# Patient Record
Sex: Male | Born: 1970 | Race: Black or African American | Hispanic: No | Marital: Single | Smoking: Never smoker
Health system: Southern US, Community
[De-identification: ages and names within clinical notes are randomized; demographics above are authoritative.]

## PROBLEM LIST (undated history)

## (undated) DIAGNOSIS — K219 Gastro-esophageal reflux disease without esophagitis: Secondary | ICD-10-CM

## (undated) DIAGNOSIS — IMO0001 Reserved for inherently not codable concepts without codable children: Secondary | ICD-10-CM

## (undated) DIAGNOSIS — K8020 Calculus of gallbladder without cholecystitis without obstruction: Secondary | ICD-10-CM

---

## 2002-08-25 ENCOUNTER — Encounter: Payer: Self-pay | Admitting: Emergency Medicine

## 2002-08-25 ENCOUNTER — Emergency Department (HOSPITAL_COMMUNITY): Admission: EM | Admit: 2002-08-25 | Discharge: 2002-08-25 | Payer: Self-pay | Admitting: Emergency Medicine

## 2002-08-26 ENCOUNTER — Ambulatory Visit (HOSPITAL_COMMUNITY): Admission: RE | Admit: 2002-08-26 | Discharge: 2002-08-26 | Payer: Self-pay | Admitting: Emergency Medicine

## 2002-08-26 ENCOUNTER — Encounter: Payer: Self-pay | Admitting: Emergency Medicine

## 2002-08-26 ENCOUNTER — Encounter: Payer: Self-pay | Admitting: *Deleted

## 2002-08-26 ENCOUNTER — Emergency Department (HOSPITAL_COMMUNITY): Admission: EM | Admit: 2002-08-26 | Discharge: 2002-08-26 | Payer: Self-pay | Admitting: *Deleted

## 2002-09-01 ENCOUNTER — Emergency Department (HOSPITAL_COMMUNITY): Admission: EM | Admit: 2002-09-01 | Discharge: 2002-09-01 | Payer: Self-pay | Admitting: Emergency Medicine

## 2006-12-10 ENCOUNTER — Emergency Department (HOSPITAL_COMMUNITY): Admission: EM | Admit: 2006-12-10 | Discharge: 2006-12-10 | Payer: Self-pay | Admitting: Emergency Medicine

## 2008-07-28 ENCOUNTER — Ambulatory Visit (HOSPITAL_COMMUNITY): Admission: RE | Admit: 2008-07-28 | Discharge: 2008-07-28 | Payer: Self-pay | Admitting: Family Medicine

## 2008-12-01 ENCOUNTER — Encounter (INDEPENDENT_AMBULATORY_CARE_PROVIDER_SITE_OTHER): Payer: Self-pay | Admitting: *Deleted

## 2009-07-08 ENCOUNTER — Emergency Department (HOSPITAL_COMMUNITY): Admission: EM | Admit: 2009-07-08 | Discharge: 2009-07-08 | Payer: Self-pay | Admitting: Emergency Medicine

## 2014-02-27 ENCOUNTER — Emergency Department (HOSPITAL_COMMUNITY): Payer: Self-pay

## 2014-02-27 ENCOUNTER — Encounter (HOSPITAL_COMMUNITY): Payer: Self-pay | Admitting: Emergency Medicine

## 2014-02-27 ENCOUNTER — Emergency Department (HOSPITAL_COMMUNITY)
Admission: EM | Admit: 2014-02-27 | Discharge: 2014-02-27 | Disposition: A | Discharge status: Home / Self Care | Payer: Self-pay | Attending: Emergency Medicine | Admitting: Emergency Medicine

## 2014-02-27 DIAGNOSIS — R000 Tachycardia, unspecified: Secondary | ICD-10-CM | POA: Insufficient documentation

## 2014-02-27 DIAGNOSIS — Z8719 Personal history of other diseases of the digestive system: Secondary | ICD-10-CM | POA: Insufficient documentation

## 2014-02-27 DIAGNOSIS — N453 Epididymo-orchitis: Secondary | ICD-10-CM | POA: Insufficient documentation

## 2014-02-27 DIAGNOSIS — N451 Epididymitis: Secondary | ICD-10-CM

## 2014-02-27 HISTORY — DX: Gastro-esophageal reflux disease without esophagitis: K21.9

## 2014-02-27 HISTORY — DX: Reserved for inherently not codable concepts without codable children: IMO0001

## 2014-02-27 LAB — URINALYSIS, ROUTINE W REFLEX MICROSCOPIC
Bilirubin Urine: NEGATIVE
GLUCOSE, UA: NEGATIVE mg/dL
HGB URINE DIPSTICK: NEGATIVE
KETONES UR: NEGATIVE mg/dL
Leukocytes, UA: NEGATIVE
Nitrite: POSITIVE — AB
Protein, ur: NEGATIVE mg/dL
Specific Gravity, Urine: 1.02 (ref 1.005–1.030)
Urobilinogen, UA: 1 mg/dL (ref 0.0–1.0)
pH: 7 (ref 5.0–8.0)

## 2014-02-27 LAB — URINE MICROSCOPIC-ADD ON

## 2014-02-27 MED ORDER — HYDROMORPHONE HCL PF 1 MG/ML IJ SOLN
1.0000 mg | Freq: Once | INTRAMUSCULAR | Status: AC
  Administered 2014-02-27: 1 mg via INTRAMUSCULAR
  Filled 2014-02-27: qty 1

## 2014-02-27 MED ORDER — DOXYCYCLINE HYCLATE 100 MG PO TABS
100.0000 mg | ORAL_TABLET | Freq: Once | ORAL | Status: AC
  Administered 2014-02-27: 100 mg via ORAL
  Filled 2014-02-27: qty 1

## 2014-02-27 MED ORDER — IBUPROFEN 800 MG PO TABS
800.0000 mg | ORAL_TABLET | Freq: Once | ORAL | Status: AC
  Administered 2014-02-27: 800 mg via ORAL
  Filled 2014-02-27: qty 1

## 2014-02-27 MED ORDER — DOXYCYCLINE HYCLATE 100 MG PO CAPS: 100.0000 mg | ORAL_CAPSULE | Freq: Two times a day (BID) | ORAL | Status: DC

## 2014-02-27 MED ORDER — ONDANSETRON 8 MG PO TBDP
8.0000 mg | ORAL_TABLET | Freq: Once | ORAL | Status: AC
  Administered 2014-02-27: 8 mg via ORAL
  Filled 2014-02-27: qty 1

## 2014-02-27 MED ORDER — OXYCODONE-ACETAMINOPHEN 5-325 MG PO TABS: 1.0000 | ORAL_TABLET | ORAL | Status: DC | PRN

## 2014-02-27 MED ORDER — CEFTRIAXONE SODIUM 250 MG IJ SOLR
250.0000 mg | Freq: Once | INTRAMUSCULAR | Status: AC
  Administered 2014-02-27: 250 mg via INTRAMUSCULAR
  Filled 2014-02-27: qty 250

## 2014-02-27 MED ORDER — IBUPROFEN 800 MG PO TABS: 800.0000 mg | ORAL_TABLET | Freq: Three times a day (TID) | ORAL | Status: DC

## 2014-02-27 MED ORDER — LIDOCAINE HCL (PF) 1 % IJ SOLN
INTRAMUSCULAR | Status: AC
  Administered 2014-02-27: 2 mL
  Filled 2014-02-27: qty 5

## 2014-02-27 MED ORDER — ACETAMINOPHEN 500 MG PO TABS
1000.0000 mg | ORAL_TABLET | Freq: Once | ORAL | Status: AC
  Administered 2014-02-27: 1000 mg via ORAL
  Filled 2014-02-27: qty 2

## 2014-02-27 NOTE — Discharge Instructions (Signed)
Epididymitis  Epididymitis is a swelling (inflammation) of the epididymis. The epididymis is a cord-like structure along the back part of the testicle. Epididymitis is usually, but not always, caused by infection. This is usually a sudden problem beginning with chills, fever and pain behind the scrotum and in the testicle. There may be swelling and redness of the testicle.  DIAGNOSIS   Physical examination will reveal a tender, swollen epididymis. Sometimes, cultures are obtained from the urine or from prostate secretions to help find out if there is an infection or if the cause is a different problem. Sometimes, blood work is performed to see if your white blood cell count is elevated and if a germ (bacterial) or viral infection is present. Using this knowledge, an appropriate medicine which kills germs (antibiotic) can be chosen by your caregiver. A viral infection causing epididymitis will most often go away (resolve) without treatment.  HOME CARE INSTRUCTIONS   · Hot sitz baths for 20 minutes, 4 times per day, may help relieve pain.  · Only take over-the-counter or prescription medicines for pain, discomfort or fever as directed by your caregiver.  · Take all medicines, including antibiotics, as directed. Take the antibiotics for the full prescribed length of time even if you are feeling better.  · It is very important to keep all follow-up appointments.  SEEK IMMEDIATE MEDICAL CARE IF:   · You have a fever.  · You have pain not relieved with medicines.  · You have any worsening of your problems.  · Your pain seems to come and go.  · You develop pain, redness, and swelling in the scrotum and surrounding areas.  MAKE SURE YOU:   · Understand these instructions.  · Will watch your condition.  · Will get help right away if you are not doing well or get worse.  Document Released: 09/08/2000 Document Revised: 12/04/2011 Document Reviewed: 07/29/2009  [employer]® Patient Information ©2014 [employer].

## 2014-02-27 NOTE — ED Provider Notes (Signed)
CSN: [id number]     Arrival date & time 02/27/14  1051 History   First MD Initiated Contact with Patient 02/27/14 1143     Chief Complaint  Patient presents with  . Groin Pain     (Consider location/radiation/quality/duration/timing/severity/associated sxs/prior Treatment) Patient is a 43 y.o. male presenting with groin pain. The history is provided by the patient.  Groin Pain This is a new problem. The current episode started yesterday. The problem occurs constantly. The problem has been unchanged. Pertinent negatives include no abdominal pain, arthralgias, change in bowel habit, chest pain, chills, diaphoresis, fever, headaches, myalgias, nausea, neck pain, numbness, rash, sore throat, swollen glands, urinary symptoms, vomiting or weakness. The symptoms are aggravated by standing, walking and bending. He has tried nothing for the symptoms. The treatment provided no relief.   Patient reports sudden onset of pain and swelling of his right testicle that began on the day prior to ED arrival after he bent over to pick up a large rock.  He states that he felt a "pull" to his groin right away and pain has been persistent since that time.  Pain is worse with movement of the scrotum, standing or walking.  He denies difficulty urinating, fever, chills, vomiting, abdominal pain, hematuria, penile pain or d/c, or back or leg pain.  He states he has not tried to ejaculate since the injury.  He has not taken any medications or tried any therapies prior to ED arrival.    Past Medical History  Diagnosis Date  . Reflux    History reviewed. No pertinent past surgical history. History reviewed. No pertinent family history. History  Substance Use Topics  . Smoking status: Never Smoker   . Smokeless tobacco: Never Used  . Alcohol Use: No    Review of Systems  Constitutional: Negative for fever, chills, diaphoresis and appetite change.  HENT: Negative for sore throat.   Respiratory: Negative for chest  tightness and shortness of breath.   Cardiovascular: Negative for chest pain.  Gastrointestinal: Negative for nausea, vomiting, abdominal pain, blood in stool and change in bowel habit.  Genitourinary: Positive for scrotal swelling and testicular pain. Negative for dysuria, frequency, hematuria, flank pain, decreased urine volume, discharge, difficulty urinating, genital sores and penile pain.  Musculoskeletal: Negative for arthralgias, back pain, myalgias and neck pain.  Skin: Negative for color change and rash.  Neurological: Negative for dizziness, weakness, numbness and headaches.  Hematological: Negative for adenopathy.  All other systems reviewed and are negative.     Allergies  Aspirin  Home Medications   Prior to Admission medications   Not on File   BP 129/76  Pulse 124  Temp(Src) 102.4 F (39.1 C) (Oral)  Resp 20  Ht 5\' 7"  (1.702 m)  Wt 216 lb 4.8 oz (98.113 kg)  BMI 33.87 kg/m2  SpO2 100% Physical Exam  Nursing note and vitals reviewed. Constitutional: He is oriented to person, place, and time. He appears well-developed and well-nourished. No distress.  HENT:  Head: Normocephalic and atraumatic.  Mouth/Throat: Oropharynx is clear and moist.  Neck: Normal range of motion. Neck supple.  Cardiovascular: Regular rhythm, normal heart sounds and intact distal pulses.  Tachycardia present.   No murmur heard. Pulmonary/Chest: Effort normal and breath sounds normal. No respiratory distress. He exhibits no tenderness.  Abdominal: Soft. He exhibits no distension and no mass. There is no tenderness. There is no rebound and no guarding. Hernia confirmed negative in the right inguinal area and confirmed negative in the left  inguinal area.  Genitourinary: Cremasteric reflex is present. Right testis shows swelling and tenderness. Right testis shows no mass. Left testis shows no mass, no swelling and no tenderness. Circumcised. No penile erythema or penile tenderness. No discharge  found.  Moderately enlarged right testicle on exam.  Prehn's sign is negative.  No palpable masses.  No erythema of the scrotum.  Left testicle is NT  Musculoskeletal: Normal range of motion.  Lymphadenopathy:    He has no cervical adenopathy.       Right: No inguinal adenopathy present.       Left: No inguinal adenopathy present.  Neurological: He is alert and oriented to person, place, and time. He exhibits normal muscle tone. Coordination normal.  Skin: Skin is warm and dry.  Psychiatric: He has a normal mood and affect.    ED Course  Procedures (including critical care time) Labs Review Labs Reviewed  URINALYSIS, ROUTINE W REFLEX MICROSCOPIC - Abnormal; Notable for the following:    APPearance HAZY (*)    Nitrite POSITIVE (*)    All other components within normal limits  URINE MICROSCOPIC-ADD ON - Abnormal; Notable for the following:    Bacteria, UA MANY (*)    All other components within normal limits  URINE CULTURE    Imaging Review US Scrotum  02/27/2014   ADDENDUM REPORT: 02/27/2014 17:01  ADDENDUM: Note that upon further review of the vascular portion of the exam, there is asymmetric increased flow to the right epididymis which correlates with patient's presenting symptoms and likely represents acute epididymitis.  These findings were discussed with Pauline Aus at the time of dictation.   Electronically Signed   By: Elberta Fortis M.D.   On: 02/27/2014 17:01   02/27/2014   ADDENDUM REPORT: 02/27/2014 14:51  ADDENDUM: Note that an arterial-venous Doppler evaluation was also performed with this exam.  Exam demonstrated normal symmetric arterial and venous flow to the testicles bilaterally.   Electronically Signed   By: Elberta Fortis M.D.   On: 02/27/2014 14:51   02/27/2014   CLINICAL DATA:  Right scrotal/groin pain for 2 days.  EXAM: ULTRASOUND OF SCROTUM  TECHNIQUE: Complete ultrasound examination of the testicles, epididymis, and other scrotal structures was performed.   COMPARISON:  None.  FINDINGS: Right testicle  Measurements: 3.7 x 4.1 x 5.9 cm. No mass or microlithiasis visualized.  Left testicle  Measurements: 2.5 x 3.2 x 5.1 cm. No mass or microlithiasis visualized.  Right epididymis:  Normal in size and appearance.  Left epididymis:  Normal in size and appearance.  Hydrocele:  Small simple bilateral hydroceles.  Varicocele:  None visualized.  Symmetric vascular flow to the testicles bilaterally.  IMPRESSION: Normal sonographic evaluation of the testicles.  Small bilateral simple hydroceles.  Electronically Signed: By: Elberta Fortis M.D. On: 02/27/2014 14:28     EKG Interpretation None      MDM   Final diagnoses:  Epididymitis, right   Pt is well appearing, non-toxic.  Reports h/o of sudden onset right testicle pain after lifting a large rock at work yesterday.  He denies any other symptoms, abdominal pain, vomiting, rash, penile discharge or redness of the scrotum.    Enlarged, tender right testicle without penile discharge, erythema or palpable hernia.  Will order U/A and scrotal US.     1650  Consulted radiologist, Dr. Micheline Maze.  After further review of the vascular portion of the [place] and discussion of pt's hx and lab results, it was noted that an increased flow is  present to the right epidymidis which correlates to the patient's sx's and likely represents acute epididymitis.    I have discussed the patient's hx , U/A and exam findings with Dr. Clarene DukeMcManus as well.  Plan includes treatment with Rocephin and doxycycline.      Urine culture is pending.  Pt remains well appearing and non-toxic.  He is resting comfortably, drank fluids, watching TV.  Tylenol and Ibuprofen given for fever.  Patient states pain is improved and he is feeling better.  He continues to deny any symptoms other than right testicle/scrotal pain.  No other source of fever found on exam.  Abdomen remains soft, NT.    patient's heart rate remains in the 120's and on further chart review,  it was noted that pt also seen here in 2008 with HR in 120's.  He agrees to doxy x 10 days, ibuprofen and #15 percocet for pain.  referral for Dr. Jerre SimonJavaid and i have advised him that he will also need further eval by his PMD, Dr. Renard MatterMcInnis, regarding the tachycardia.  He also advised and agrees to return here for any worsening symptoms.  He is ambulatory and appears stable for d/c  Cali Hope L. Marvelle Span, PA-C 03/01/14 1326

## 2014-02-27 NOTE — ED Notes (Signed)
Pain rt groin after picking up a rock yesterday.

## 2014-02-27 NOTE — ED Notes (Signed)
Patient was working [employer] yesterday lifting a rock when he felt a sudden pain in R groin.  No hx hernia.

## 2014-02-27 NOTE — ED Notes (Signed)
Alert, asking for something to drink,

## 2014-03-01 LAB — URINE CULTURE: Colony Count: >=100000

## 2014-03-02 NOTE — Progress Notes (Signed)
ED Antimicrobial Stewardship Positive Culture Follow Up   Martin Alvarado is an 43 y.o. male who presented to [facility] on 02/27/2014 with a chief complaint of  Chief Complaint  Patient presents with  . Groin Pain    Recent Results (from the past 720 hour(s))  URINE CULTURE     Status: None   Collection Time    02/27/14  1:40 PM      Result Value Ref Range Status   Specimen Description URINE, CLEAN CATCH   Final   Special Requests NONE   Final   Culture  Setup Time     Final   Value: 02/27/2014 23:36     Performed at [facility] Count     Final   Value: >=100,000 COLONIES/ML     Performed at [facility]   Culture     Final   Value: ESCHERICHIA COLI     Performed at [facility]   Report Status 03/01/2014 FINAL   Final   Organism ID, Bacteria ESCHERICHIA COLI   Final    [x]  Treated with doxycycline, organism resistant to prescribed antimicrobial 43 yo who came in with groin pain. He was dx with epididymitis and was dc home on doxy. Urine culture has grown out e.coli. We'll treat with amoxicillin since it's sens.  New antibiotic prescription:   Start Amoxicillin 500mg  TID x10days  ED Provider: Jaynie Crumble, PA   Ulyses Southward, PharmD Pager: [phone] Infectious Diseases Pharmacist Phone# [phone]

## 2014-03-03 ENCOUNTER — Telehealth (HOSPITAL_BASED_OUTPATIENT_CLINIC_OR_DEPARTMENT_OTHER): Payer: Self-pay | Admitting: Emergency Medicine

## 2014-03-03 NOTE — Telephone Encounter (Signed)
Post ED Visit - Positive Culture Follow-up: Successful Patient Follow-Up  Culture assessed and recommendations reviewed by: []  Wes Dulaney, Pharm.D., BCPS []  Celedonio Miyamoto, Pharm.D., BCPS []  Georgina Pillion, [address].D., BCPS [x]  [place], [place].D., BCPS, AAHIVP []  Estella Husk, Pharm.D., BCPS, AAHIVP  Positive urine culture  []  Patient discharged without antimicrobial prescription and treatment is now indicated [x]  Organism is resistant to prescribed ED discharge antimicrobial []  Patient with positive blood cultures  Changes discussed with ED provider: Jaynie Crumble PA-C New antibiotic prescription: Amoxicillin 500 mg PO TID x 10 days    Abie Cheek 03/03/2014, 1:18 PM

## 2014-03-03 NOTE — ED Provider Notes (Signed)
Medical screening examination/treatment/procedure(s) were performed by non-physician practitioner and as supervising physician I was immediately available for consultation/collaboration.   EKG Interpretation None        Laray Anger, DO 03/03/14 1147

## 2019-02-14 ENCOUNTER — Other Ambulatory Visit: Payer: Self-pay

## 2019-02-14 ENCOUNTER — Emergency Department (HOSPITAL_COMMUNITY)
Admission: EM | Admit: 2019-02-14 | Discharge: 2019-02-14 | Disposition: A | Discharge status: Home / Self Care | Payer: Self-pay | Attending: Emergency Medicine | Admitting: Emergency Medicine

## 2019-02-14 ENCOUNTER — Encounter (HOSPITAL_COMMUNITY): Payer: Self-pay | Admitting: Emergency Medicine

## 2019-02-14 ENCOUNTER — Emergency Department (HOSPITAL_COMMUNITY): Payer: Self-pay

## 2019-02-14 DIAGNOSIS — R1011 Right upper quadrant pain: Secondary | ICD-10-CM

## 2019-02-14 DIAGNOSIS — K8020 Calculus of gallbladder without cholecystitis without obstruction: Secondary | ICD-10-CM | POA: Insufficient documentation

## 2019-02-14 DIAGNOSIS — K769 Liver disease, unspecified: Secondary | ICD-10-CM | POA: Insufficient documentation

## 2019-02-14 LAB — COMPREHENSIVE METABOLIC PANEL
ALT: 62 U/L — ABNORMAL HIGH (ref 0–44)
AST: 33 U/L (ref 15–41)
Albumin: 4.7 g/dL (ref 3.5–5.0)
Alkaline Phosphatase: 35 U/L — ABNORMAL LOW (ref 38–126)
Anion gap: 17 — ABNORMAL HIGH (ref 5–15)
BUN: 12 mg/dL (ref 6–20)
CO2: 20 mmol/L — ABNORMAL LOW (ref 22–32)
Calcium: 9.4 mg/dL (ref 8.9–10.3)
Chloride: 98 mmol/L (ref 98–111)
Creatinine, Ser: 1.29 mg/dL — ABNORMAL HIGH (ref 0.61–1.24)
GFR calc Af Amer: >60 mL/min (ref >60)
GFR calc non Af Amer: >60 mL/min (ref >60)
Glucose, Bld: 93 mg/dL (ref 70–99)
Potassium: 3.9 mmol/L (ref 3.5–5.1)
Sodium: 135 mmol/L (ref 135–145)
Total Bilirubin: 2.5 mg/dL — ABNORMAL HIGH (ref 0.3–1.2)
Total Protein: 7.9 g/dL (ref 6.5–8.1)

## 2019-02-14 LAB — CBC
HCT: 41.7 % (ref 39.0–52.0)
Hemoglobin: 13.7 g/dL (ref 13.0–17.0)
MCH: 29 pg (ref 26.0–34.0)
MCHC: 32.9 g/dL (ref 30.0–36.0)
MCV: 88.2 fL (ref 80.0–100.0)
Platelets: 159 10*3/uL (ref 150–400)
RBC: 4.73 MIL/uL (ref 4.22–5.81)
RDW: 12.3 % (ref 11.5–15.5)
WBC: 5.5 10*3/uL (ref 4.0–10.5)
nRBC: 0 % (ref 0.0–0.2)

## 2019-02-14 LAB — TROPONIN I
Troponin I: <0.03 ng/mL (ref <0.03)
Troponin I: <0.03 ng/mL (ref <0.03)

## 2019-02-14 LAB — D-DIMER, QUANTITATIVE (NOT AT ARMC): D-Dimer, Quant: <0.27 ug/mL-FEU (ref 0.00–0.50)

## 2019-02-14 LAB — LIPASE, BLOOD: Lipase: 52 U/L — ABNORMAL HIGH (ref 11–51)

## 2019-02-14 MED ORDER — ALUM & MAG HYDROXIDE-SIMETH 200-200-20 MG/5ML PO SUSP
30.0000 mL | Freq: Once | ORAL | Status: AC
  Administered 2019-02-14: 30 mL via ORAL
  Filled 2019-02-14: qty 30

## 2019-02-14 MED ORDER — FAMOTIDINE IN NACL 20-0.9 MG/50ML-% IV SOLN
20.0000 mg | Freq: Once | INTRAVENOUS | Status: AC
  Administered 2019-02-14: 20 mg via INTRAVENOUS
  Filled 2019-02-14: qty 50

## 2019-02-14 MED ORDER — SODIUM CHLORIDE 0.9 % IV BOLUS
500.0000 mL | Freq: Once | INTRAVENOUS | Status: AC
  Administered 2019-02-14: 500 mL via INTRAVENOUS

## 2019-02-14 MED ORDER — SUCRALFATE 1 G PO TABS: 1.0000 g | ORAL_TABLET | Freq: Three times a day (TID) | ORAL | 0 refills | Status: AC

## 2019-02-14 MED ORDER — SUCRALFATE 1 G PO TABS
1.0000 g | ORAL_TABLET | Freq: Once | ORAL | Status: AC
  Administered 2019-02-14: 1 g via ORAL
  Filled 2019-02-14: qty 1

## 2019-02-14 MED ORDER — ACETAMINOPHEN 325 MG PO TABS
650.0000 mg | ORAL_TABLET | Freq: Once | ORAL | Status: AC
  Administered 2019-02-14: 650 mg via ORAL
  Filled 2019-02-14: qty 2

## 2019-02-14 MED ORDER — PANTOPRAZOLE SODIUM 20 MG PO TBEC: 20.0000 mg | DELAYED_RELEASE_TABLET | Freq: Every day | ORAL | 0 refills | Status: DC

## 2019-02-14 MED ORDER — SUCRALFATE 1 G PO TABS: 1.0000 g | ORAL_TABLET | Freq: Three times a day (TID) | ORAL | 0 refills | Status: DC

## 2019-02-14 MED ORDER — PANTOPRAZOLE SODIUM 20 MG PO TBEC: 20.0000 mg | DELAYED_RELEASE_TABLET | Freq: Every day | ORAL | 0 refills | Status: AC

## 2019-02-14 NOTE — ED Triage Notes (Signed)
Pt c/o that he has acid reflux and having really bad flare ups esp when laying down. C/o mid chest pains and little SOB that is worse today.

## 2019-02-14 NOTE — ED Notes (Signed)
US at bedside

## 2019-02-14 NOTE — Discharge Instructions (Signed)
Please take medications as prescribed.  Please follow-up with gastroenterology in regards to the lesion on your liver.  Please follow-up with general surgery in regards to the gallstones in your gallbladder.  Please return to the ER sooner if you have any new or worsening symptoms, or if you have any of the following symptoms:  Abdominal pain that does not go away.  You have a fever.  You keep throwing up (vomiting).  The pain is felt only in portions of the abdomen. Pain in the right side could possibly be appendicitis. In an adult, pain in the left lower portion of the abdomen could be colitis or diverticulitis.  You pass bloody or black tarry stools.  There is bright red blood in the stool.  The constipation stays for more than 4 days.  There is belly (abdominal) or rectal pain.  You do not seem to be getting better.  You have any questions or concerns.

## 2019-02-14 NOTE — ED Provider Notes (Signed)
[facility]-EMERGENCY DEPT Provider Note   CSN: [id number] Arrival date & time: 02/14/19  1616    History   Chief Complaint Chief Complaint  Patient presents with   Chest Pain    HPI Martin Alvarado is a 48 y.o. male.     HPI   Pt is a 48 y/o male with a h/o GERD who presents to the ED today c/o midsternal chest pain that has been present intermittently for the last 2 weeks but worsened over the last week.  States that symptoms are intermittent and last for about 30 minutes at a time.  Pain occurs almost every time he eats.  Patient states he does a lot of exercise by walking and it does not occur with exertion.  The pain feels sharp and stabbing. Rates pain 7-7/10. He states it feels similar to when he has had heartburn in the past.   It is also associated with shortness of breath.   He has tried lifestyle modification such as decreasing fried foods, carbonated beverages and increasing exercise to lose weight.  He also started taking his omeprazole yesterday.  He states that it helped in the morning and throughout the day however after laying down at night symptoms returned.  States he saw his PCP and gastroenterologist in the past who diagnosed him with GERD after EGD.  Denies hypertension, hyperlipidemia apnea, diabetes or tobacco use.  Denies any early family history of heart disease.  Denies frequent EtOH use or NSAID use.  States is allergic to aspirin. Denies leg pain/swelling, hemoptysis, recent surgery/trauma, recent long travel, hormone use, personal hx of cancer, or hx of DVT/PE.   Past Medical History:  Diagnosis Date   Reflux     There are no active problems to display for this patient.  History reviewed. No pertinent surgical history.    Home Medications    Prior to Admission medications   Medication Sig Start Date End Date Taking? Authorizing Provider  doxycycline (VIBRAMYCIN) 100 MG capsule Take 1 capsule (100 mg total) by mouth 2  (two) times daily. For 10 days Patient not taking: Reported on 02/14/2019 02/27/14   Pauline Aus, PA-C  ibuprofen (ADVIL,MOTRIN) 800 MG tablet Take 1 tablet (800 mg total) by mouth 3 (three) times daily. Patient not taking: Reported on 02/14/2019 02/27/14   Pauline Aus, PA-C  oxyCODONE-acetaminophen (PERCOCET/ROXICET) 5-325 MG per tablet Take 1 tablet by mouth every 4 (four) hours as needed for severe pain. Patient not taking: Reported on 02/14/2019 02/27/14   Triplett, Tammy, PA-C  pantoprazole (PROTONIX) 20 MG tablet Take 1 tablet (20 mg total) by mouth daily for 14 days. 02/14/19 02/28/19  Jolean Madariaga S, PA-C  sucralfate (CARAFATE) 1 g tablet Take 1 tablet (1 g total) by mouth 4 (four) times daily -  with meals and at bedtime for 13 days. 02/14/19 02/27/19  Traycen Goyer S, PA-C    Family History No family history on file.  Social History Social History   Tobacco Use   Smoking status: Never Smoker   Smokeless tobacco: Never Used  Substance Use Topics   Alcohol use: No   Drug use: No     Allergies   Aspirin   Review of Systems Review of Systems  Constitutional: Negative for chills and fever.  HENT: Negative for ear pain and sore throat.   Eyes: Negative for visual disturbance.  Respiratory: Positive for shortness of breath. Negative for cough.   Cardiovascular: Positive for chest pain. Negative for leg  swelling.  Gastrointestinal: Negative for abdominal pain, constipation, diarrhea, nausea and vomiting.  Genitourinary: Negative for dysuria and hematuria.  Musculoskeletal: Negative for back pain.  Skin: Negative for color change and rash.  Neurological: Negative for headaches.  All other systems reviewed and are negative.    Physical Exam Updated Vital Signs BP 132/89    Pulse 97    Temp 99.7 F (37.6 C) (Oral)    Resp 19    Ht 5\' 7"  (1.702 m)    Wt 88 kg    SpO2 100%    BMI 30.38 kg/m   Physical Exam Vitals signs and nursing note reviewed.  Constitutional:        Appearance: He is well-developed. He is not ill-appearing or toxic-appearing.  HENT:     Head: Normocephalic and atraumatic.  Eyes:     Conjunctiva/sclera: Conjunctivae normal.  Neck:     Musculoskeletal: Neck supple.  Cardiovascular:     Rate and Rhythm: Regular rhythm. Tachycardia present.     Pulses:          Dorsalis pedis pulses are 2+ on the right side and 2+ on the left side.     Heart sounds: Normal heart sounds. No murmur.  Pulmonary:     Effort: Pulmonary effort is normal. No respiratory distress.     Breath sounds: Normal breath sounds. No decreased breath sounds, wheezing, rhonchi or rales.  Chest:     Chest wall: No tenderness or crepitus.  Abdominal:     General: Bowel sounds are normal.     Palpations: Abdomen is soft.     Tenderness: There is no abdominal tenderness. There is no guarding or rebound.  Musculoskeletal:     Right lower leg: He exhibits no tenderness. No edema.     Left lower leg: He exhibits no tenderness. No edema.  Skin:    General: Skin is warm and dry.  Neurological:     Mental Status: He is alert.      ED Treatments / Results  Labs (all labs ordered are listed, but only abnormal results are displayed) Labs Reviewed  COMPREHENSIVE METABOLIC PANEL - Abnormal; Notable for the following components:      Result Value   CO2 20 (*)    Creatinine, Ser 1.29 (*)    ALT 62 (*)    Alkaline Phosphatase 35 (*)    Total Bilirubin 2.5 (*)    Anion gap 17 (*)    All other components within normal limits  LIPASE, BLOOD - Abnormal; Notable for the following components:   Lipase 52 (*)    All other components within normal limits  CBC  TROPONIN I  D-DIMER, QUANTITATIVE (NOT AT [facility])  TROPONIN I    EKG EKG Interpretation  Date/Time:  Friday Feb 14 2019 16:25:04 EDT Ventricular Rate:  120 PR Interval:    QRS Duration: 92 QT Interval:  307 QTC Calculation: 434 R Axis:   56 Text Interpretation:  Sinus tachycardia Nonspecific T  abnormalities, inferior leads Baseline wander in lead(s) II III aVL aVF V5 Confirmed by Pricilla Loveless [phone]) on 02/14/2019 5:22:02 PM    EKG Interpretation  Date/Time:  Friday Feb 14 2019 21:47:48 EDT Ventricular Rate:  99 PR Interval:    QRS Duration: 83 QT Interval:  356 QTC Calculation: 457 R Axis:   34 Text Interpretation:  Sinus rhythm Abnormal R-wave progression, early transition Borderline T abnormalities, inferior leads tachycardia is improved compared to earlier in the day Confirmed by Pricilla Loveless (  [id number]) on 02/14/2019 10:07:26 PM        Radiology Dg Chest 2 View  Result Date: 02/14/2019 CLINICAL DATA:  Chest pain, reflux, shortness of breath EXAM: CHEST - 2 VIEW COMPARISON:  None. FINDINGS: The heart size and mediastinal contours are within normal limits. Both lungs are clear. The visualized skeletal structures are unremarkable. IMPRESSION: No active cardiopulmonary disease. Electronically Signed   By: Judie Petit.  Shick M.D.   On: 02/14/2019 17:41   US Abdomen Limited Ruq  Result Date: 02/14/2019 CLINICAL DATA:  Initial evaluation for acute right upper quadrant and epigastric pain. EXAM: ULTRASOUND ABDOMEN LIMITED RIGHT UPPER QUADRANT COMPARISON:  None. FINDINGS: Gallbladder: Sludge and/or possible small stones present within the gallbladder lumen. Gallbladder wall measure within normal limits at 1.5 mm in thickness. No free pericholecystic fluid. No sonographic Murphy sign elicited on exam. Common bile duct: Diameter: 2.6 mm Liver: 2.3 x 2.5 x 2.6 cm echogenic lesion within the subcapsular left hepatic lobe, which could reflect a hemangioma or possibly focal fat deposition. Within normal limits in parenchymal echogenicity. Portal vein is patent on color Doppler imaging with normal direction of blood flow towards the liver. IMPRESSION: 1. Stones and sludge within the gallbladder lumen. No sonographic features to suggest acute cholecystitis. 2. No biliary dilatation. 3. 2.6 cm  echogenic lesion within the left hepatic lobe, indeterminate. Primary differential considerations include a benign hemangioma versus focal fat deposition. Follow-up examination with nonemergent MRI of the abdomen could be performed for further evaluation. Electronically Signed   By: Rise Mu M.D.   On: 02/14/2019 21:18    Procedures Procedures (including critical care time)  Medications Ordered in ED Medications  acetaminophen (TYLENOL) tablet 650 mg (650 mg Oral Given 02/14/19 1833)  famotidine (PEPCID) IVPB 20 mg premix (0 mg Intravenous Stopped 02/14/19 2032)  sucralfate (CARAFATE) tablet 1 g (1 g Oral Given 02/14/19 1834)  alum & mag hydroxide-simeth (MAALOX/MYLANTA) 200-200-20 MG/5ML suspension 30 mL (30 mLs Oral Given 02/14/19 1833)  sodium chloride 0.9 % bolus 500 mL (0 mLs Intravenous Stopped 02/14/19 2032)     Initial Impression / Assessment and Plan / ED Course  I have reviewed the triage vital signs and the nursing notes.  Pertinent labs & imaging results that were available during my care of the patient were reviewed by me and considered in my medical decision making (see chart for details).   Final Clinical Impressions(s) / ED Diagnoses   Final diagnoses:  RUQ pain  Gallstones  Liver lesion   Pt presenting with chest pain that he feels is consistent with acid reflux. Pain is intermittent, midsternal. it is associated with eating. associated with sob. No cough, fevers or pleuritic pain.   VS initially with marginal tachycardia, otherwise reassuring.  CBC without leukocytosis, no anemia CMP with low bicarb at 20, marginally elevated cr at 1.29. elevated ALT at 62, and elevated bilirubin at 2.5. Lipase is marginally elevated Trop negative, delta trop negative -- HEART score 2. This seems less likely related to ACS Ddimer negative -- low suspicion for PE  CXR negative for PNA, PTX or other obvious abnormality.   Initial EKG Sinus tachycardia Nonspecific T  abnormalities, inferior leads Baseline wander in lead(s) II III aVL aVF V5   Repeat EKG Sinus rhythm Abnormal R-wave progression, early transition Borderline T abnormalities, inferior leads tachycardia is improved compared to earlier in the day    Doubt dissection or other emergent cardiac/pulm etiology. Sxs seem much more likely to be GI related. Due to  elevated bilirubin, mildly elevated ALT, and chest pain worse with eating, added RUQ US to r/o gallbladder pathology.   RUQ US with Stones and sludge within the gallbladder lumen. No sonographic features to suggest acute cholecystitis. No biliary dilatation. 2.6 cm echogenic lesion within the left hepatic lobe, indeterminate. Primary differential considerations include a benign hemangioma versus focal fat deposition. Follow-up examination with nonemergent MRI of the abdomen could be performed for further evaluation.  CONSULT With GI, Dr. Dulce Sellarutlaw, who does not think that patient needs emergent MRCP at this time.    Discussed findings with patient and plan for f/u with GI for his liver lesion and with surgery for his gallstones. Will give rx for carafate and advise him to continue his omeprazole. Gave strict return precautions for any new or worsening sxs. He voices understanding of the plan and reasons to return. All questions answered.   ED Discharge Orders         Ordered    sucralfate (CARAFATE) 1 g tablet  3 times daily with meals & bedtime     02/14/19 2229    pantoprazole (PROTONIX) 20 MG tablet  Daily     02/14/19 2229           Karrie MeresCouture, Rufus Cypert S, PA-C 02/14/19 2231    Pricilla LovelessGoldston, Scott, MD 02/14/19 2253

## 2019-02-17 ENCOUNTER — Other Ambulatory Visit: Payer: Self-pay

## 2019-02-17 ENCOUNTER — Emergency Department (HOSPITAL_COMMUNITY): Payer: Self-pay

## 2019-02-17 ENCOUNTER — Emergency Department (HOSPITAL_COMMUNITY)
Admission: EM | Admit: 2019-02-17 | Discharge: 2019-02-17 | Disposition: A | Discharge status: Home / Self Care | Payer: Self-pay | Attending: Emergency Medicine | Admitting: Emergency Medicine

## 2019-02-17 ENCOUNTER — Encounter (HOSPITAL_COMMUNITY): Payer: Self-pay

## 2019-02-17 DIAGNOSIS — K219 Gastro-esophageal reflux disease without esophagitis: Secondary | ICD-10-CM | POA: Insufficient documentation

## 2019-02-17 DIAGNOSIS — R0789 Other chest pain: Secondary | ICD-10-CM

## 2019-02-17 LAB — CBC
HCT: 42.4 % (ref 39.0–52.0)
Hemoglobin: 14.2 g/dL (ref 13.0–17.0)
MCH: 28.7 pg (ref 26.0–34.0)
MCHC: 33.5 g/dL (ref 30.0–36.0)
MCV: 85.8 fL (ref 80.0–100.0)
Platelets: 191 10*3/uL (ref 150–400)
RBC: 4.94 MIL/uL (ref 4.22–5.81)
RDW: 12.3 % (ref 11.5–15.5)
WBC: 5.1 10*3/uL (ref 4.0–10.5)
nRBC: 0 % (ref 0.0–0.2)

## 2019-02-17 LAB — HEPATIC FUNCTION PANEL
ALT: 72 U/L — ABNORMAL HIGH (ref 0–44)
AST: 36 U/L (ref 15–41)
Albumin: 4.8 g/dL (ref 3.5–5.0)
Alkaline Phosphatase: 37 U/L — ABNORMAL LOW (ref 38–126)
Bilirubin, Direct: 0.4 mg/dL — ABNORMAL HIGH (ref 0.0–0.2)
Indirect Bilirubin: 2 mg/dL — ABNORMAL HIGH (ref 0.3–0.9)
Total Bilirubin: 2.4 mg/dL — ABNORMAL HIGH (ref 0.3–1.2)
Total Protein: 8.1 g/dL (ref 6.5–8.1)

## 2019-02-17 LAB — BASIC METABOLIC PANEL
Anion gap: 12 (ref 5–15)
BUN: 11 mg/dL (ref 6–20)
CO2: 21 mmol/L — ABNORMAL LOW (ref 22–32)
Calcium: 10 mg/dL (ref 8.9–10.3)
Chloride: 104 mmol/L (ref 98–111)
Creatinine, Ser: 1.36 mg/dL — ABNORMAL HIGH (ref 0.61–1.24)
GFR calc Af Amer: >60 mL/min (ref >60)
GFR calc non Af Amer: >60 mL/min (ref >60)
Glucose, Bld: 146 mg/dL — ABNORMAL HIGH (ref 70–99)
Potassium: 3.9 mmol/L (ref 3.5–5.1)
Sodium: 137 mmol/L (ref 135–145)

## 2019-02-17 LAB — LIPASE, BLOOD: Lipase: 76 U/L — ABNORMAL HIGH (ref 11–51)

## 2019-02-17 LAB — TROPONIN I: Troponin I: <0.03 ng/mL (ref <0.03)

## 2019-02-17 MED ORDER — SODIUM CHLORIDE 0.9 % IV BOLUS
1000.0000 mL | Freq: Once | INTRAVENOUS | Status: AC
  Administered 2019-02-17: 22:00:00 1000 mL via INTRAVENOUS

## 2019-02-17 MED ORDER — SODIUM CHLORIDE 0.9% FLUSH
3.0000 mL | Freq: Once | INTRAVENOUS | Status: AC
  Administered 2019-02-17: 3 mL via INTRAVENOUS

## 2019-02-17 NOTE — ED Triage Notes (Addendum)
Pt states chest pain and SHOB x 1 hour. Pt states he has been taking his pantoprazole and sucralafate he was prescribed without relief. Pt states this started shortly after eating a sub. Pt also adds that he changed his time he was taking his meds at.

## 2019-02-17 NOTE — ED Provider Notes (Signed)
[facility]-EMERGENCY DEPT Provider Note   CSN: [id number] Arrival date & time: 02/17/19  1737    History   Chief Complaint Chief Complaint  Patient presents with  . Chest Pain    HPI Jory EeChristopher A Zamorano is a 48 y.o. male.     Patient is a 48 year old male who presents with epigastric pain.  He has a history of GERD and says he has had recurrent episodes of pain in his epigastrium which radiates to his chest.  He says is consistent with his prior GERD symptoms.  He is currently taking Protonix and Carafate.  He says that the pain got worse tonight.  He does get short of breath with that which is common for him.  He has no exertional symptoms.  He says that the pain comes and goes.  He was seen for similar symptoms 3 days ago in the emergency department and he had some mild elevation in his LFTs and lipase.  Gallbladder ultrasound was performed which shows gallstones which patient knows that he has but no common bile duct dilatation.  Dr. Dulce Sellarutlaw with gastroenterology was consulted and felt the patient can follow-up as an outpatient regarding this.  He states that he is calling tomorrow for an appointment.     Past Medical History:  Diagnosis Date  . Reflux     There are no active problems to display for this patient.   History reviewed. No pertinent surgical history.      Home Medications    Prior to Admission medications   Medication Sig Start Date End Date Taking? Authorizing Provider  pantoprazole (PROTONIX) 20 MG tablet Take 1 tablet (20 mg total) by mouth daily for 14 days. 02/14/19 02/28/19 Yes Couture, Cortni S, PA-C  sucralfate (CARAFATE) 1 g tablet Take 1 tablet (1 g total) by mouth 4 (four) times daily -  with meals and at bedtime for 13 days. 02/14/19 02/27/19 Yes Couture, Cortni S, PA-C  doxycycline (VIBRAMYCIN) 100 MG capsule Take 1 capsule (100 mg total) by mouth 2 (two) times daily. For 10 days Patient not taking: Reported on 02/17/2019 02/27/14    Pauline Ausriplett, Tammy, PA-C  ibuprofen (ADVIL,MOTRIN) 800 MG tablet Take 1 tablet (800 mg total) by mouth 3 (three) times daily. Patient not taking: Reported on 02/17/2019 02/27/14   Pauline Ausriplett, Tammy, PA-C  oxyCODONE-acetaminophen (PERCOCET/ROXICET) 5-325 MG per tablet Take 1 tablet by mouth every 4 (four) hours as needed for severe pain. Patient not taking: Reported on 02/17/2019 02/27/14   Pauline Ausriplett, Tammy, PA-C    Family History No family history on file.  Social History Social History   Tobacco Use  . Smoking status: Never Smoker  . Smokeless tobacco: Never Used  Substance Use Topics  . Alcohol use: No  . Drug use: No     Allergies   Aspirin   Review of Systems Review of Systems  Constitutional: Negative for chills, diaphoresis, fatigue and fever.  HENT: Negative for congestion, rhinorrhea and sneezing.   Eyes: Negative.   Respiratory: Negative for cough, chest tightness and shortness of breath.   Cardiovascular: Positive for chest pain. Negative for leg swelling.  Gastrointestinal: Positive for abdominal pain. Negative for blood in stool, diarrhea, nausea and vomiting.  Genitourinary: Negative for difficulty urinating, flank pain, frequency and hematuria.  Musculoskeletal: Negative for arthralgias and back pain.  Skin: Negative for rash.  Neurological: Negative for dizziness, speech difficulty, weakness, numbness and headaches.     Physical Exam Updated Vital Signs BP 107/71  Pulse 90   Temp 98.7 F (37.1 C) (Oral)   Resp (!) 23   Ht 5\' 7"  (1.702 m)   Wt 88 kg   SpO2 100%   BMI 30.39 kg/m   Physical Exam Constitutional:      Appearance: He is well-developed.  HENT:     Head: Normocephalic and atraumatic.  Eyes:     Pupils: Pupils are equal, round, and reactive to light.  Neck:     Musculoskeletal: Normal range of motion and neck supple.  Cardiovascular:     Rate and Rhythm: Normal rate and regular rhythm.     Heart sounds: Normal heart sounds.  Pulmonary:      Effort: Pulmonary effort is normal. No respiratory distress.     Breath sounds: Normal breath sounds. No wheezing or rales.  Chest:     Chest wall: No tenderness.  Abdominal:     General: Bowel sounds are normal.     Palpations: Abdomen is soft.     Tenderness: There is abdominal tenderness (Mild tenderness to the epigastrium). There is no guarding or rebound.  Musculoskeletal: Normal range of motion.  Lymphadenopathy:     Cervical: No cervical adenopathy.  Skin:    General: Skin is warm and dry.     Findings: No rash.  Neurological:     Mental Status: He is alert and oriented to person, place, and time.      ED Treatments / Results  Labs (all labs ordered are listed, but only abnormal results are displayed) Labs Reviewed  BASIC METABOLIC PANEL - Abnormal; Notable for the following components:      Result Value   CO2 21 (*)    Glucose, Bld 146 (*)    Creatinine, Ser 1.36 (*)    All other components within normal limits  HEPATIC FUNCTION PANEL - Abnormal; Notable for the following components:   ALT 72 (*)    Alkaline Phosphatase 37 (*)    Total Bilirubin 2.4 (*)    Bilirubin, Direct 0.4 (*)    Indirect Bilirubin 2.0 (*)    All other components within normal limits  LIPASE, BLOOD - Abnormal; Notable for the following components:   Lipase 76 (*)    All other components within normal limits  CBC  TROPONIN I    EKG EKG Interpretation  Date/Time:  Monday Feb 17 2019 17:59:49 EDT Ventricular Rate:  120 PR Interval:    QRS Duration: 81 QT Interval:  323 QTC Calculation: 457 R Axis:   47 Text Interpretation:  Sinus tachycardia Paired ventricular premature complexes Aberrant conduction of SV complex(es) Abnormal R-wave progression, early transition Abnormal T, consider ischemia, diffuse leads Confirmed by Rolan Bucco [phone]) on 02/17/2019 8:47:07 PM   Radiology Dg Chest 2 View  Result Date: 02/17/2019 CLINICAL DATA:  Chest pain and shortness of breath EXAM: CHEST  - 2 VIEW COMPARISON:  Feb 14, 2019 FINDINGS: The lungs are clear. The heart size and pulmonary vascularity are normal. No adenopathy. No pneumothorax. No bone lesions. IMPRESSION: No edema or consolidation. Electronically Signed   By: Bretta Bang III M.D.   On: 02/17/2019 18:16    Procedures Procedures (including critical care time)  Medications Ordered in ED Medications  sodium chloride flush (NS) 0.9 % injection 3 mL (3 mLs Intravenous Given 02/17/19 2128)  sodium chloride 0.9 % bolus 1,000 mL (1,000 mLs Intravenous New Bag/Given 02/17/19 2131)     Initial Impression / Assessment and Plan / ED Course  I have reviewed  the triage vital signs and the nursing notes.  Pertinent labs & imaging results that were available during my care of the patient were reviewed by me and considered in my medical decision making (see chart for details).        Patient presents with chest pain shortness of breath.  His symptoms are consistent with his prior episodes of GERD.  He says there is really not anything different today about the episode he had.  Currently he is asymptomatic.  He denies any chest pain or shortness of breath.  He has no ischemic changes on EKG.  His troponin is negative.  His LFTs are mildly elevated as well as his lipase.  It similar to values that were there 3 days ago.  He is nontender on his abdominal exam.  He is afebrile.  His white count is normal.  He is stable for discharge and will call tomorrow to make an outpatient appointment with DrDulce Sellarutlaw.  He may need an MRCP.  He was advised to continue his current medications and return here as needed for any worsening symptoms.  Final Clinical Impressions(s) / ED Diagnoses   Final diagnoses:  Atypical chest pain  Gastroesophageal reflux disease, esophagitis presence not specified    ED Discharge Orders    None       Rolan Bucco, MD 02/17/19 2235

## 2019-02-19 ENCOUNTER — Other Ambulatory Visit: Payer: Self-pay

## 2019-02-19 ENCOUNTER — Emergency Department (HOSPITAL_COMMUNITY)
Admission: EM | Admit: 2019-02-19 | Discharge: 2019-02-19 | Disposition: A | Discharge status: Home / Self Care | Payer: Self-pay | Attending: Emergency Medicine | Admitting: Emergency Medicine

## 2019-02-19 ENCOUNTER — Encounter (HOSPITAL_COMMUNITY): Payer: Self-pay | Admitting: Emergency Medicine

## 2019-02-19 DIAGNOSIS — R079 Chest pain, unspecified: Secondary | ICD-10-CM | POA: Insufficient documentation

## 2019-02-19 HISTORY — DX: Calculus of gallbladder without cholecystitis without obstruction: K80.20

## 2019-02-19 LAB — CBC WITH DIFFERENTIAL/PLATELET
Abs Immature Granulocytes: 0.01 10*3/uL (ref 0.00–0.07)
Basophils Absolute: 0.1 10*3/uL (ref 0.0–0.1)
Basophils Relative: 1 %
Eosinophils Absolute: 0 10*3/uL (ref 0.0–0.5)
Eosinophils Relative: 1 %
HCT: 38.4 % — ABNORMAL LOW (ref 39.0–52.0)
Hemoglobin: 12.9 g/dL — ABNORMAL LOW (ref 13.0–17.0)
Immature Granulocytes: 0 %
Lymphocytes Relative: 29 %
Lymphs Abs: 1.5 10*3/uL (ref 0.7–4.0)
MCH: 28.7 pg (ref 26.0–34.0)
MCHC: 33.6 g/dL (ref 30.0–36.0)
MCV: 85.3 fL (ref 80.0–100.0)
Monocytes Absolute: 0.4 10*3/uL (ref 0.1–1.0)
Monocytes Relative: 8 %
Neutro Abs: 3.1 10*3/uL (ref 1.7–7.7)
Neutrophils Relative %: 61 %
Platelets: 191 10*3/uL (ref 150–400)
RBC: 4.5 MIL/uL (ref 4.22–5.81)
RDW: 12.4 % (ref 11.5–15.5)
WBC: 5.1 10*3/uL (ref 4.0–10.5)
nRBC: 0 % (ref 0.0–0.2)

## 2019-02-19 LAB — COMPREHENSIVE METABOLIC PANEL
ALT: 101 U/L — ABNORMAL HIGH (ref 0–44)
AST: 49 U/L — ABNORMAL HIGH (ref 15–41)
Albumin: 4.6 g/dL (ref 3.5–5.0)
Alkaline Phosphatase: 36 U/L — ABNORMAL LOW (ref 38–126)
Anion gap: 14 (ref 5–15)
BUN: 10 mg/dL (ref 6–20)
CO2: 18 mmol/L — ABNORMAL LOW (ref 22–32)
Calcium: 9.4 mg/dL (ref 8.9–10.3)
Chloride: 105 mmol/L (ref 98–111)
Creatinine, Ser: 1.34 mg/dL — ABNORMAL HIGH (ref 0.61–1.24)
GFR calc Af Amer: >60 mL/min (ref >60)
GFR calc non Af Amer: >60 mL/min (ref >60)
Glucose, Bld: 114 mg/dL — ABNORMAL HIGH (ref 70–99)
Potassium: 3.3 mmol/L — ABNORMAL LOW (ref 3.5–5.1)
Sodium: 137 mmol/L (ref 135–145)
Total Bilirubin: 2.6 mg/dL — ABNORMAL HIGH (ref 0.3–1.2)
Total Protein: 7.7 g/dL (ref 6.5–8.1)

## 2019-02-19 LAB — LIPASE, BLOOD: Lipase: 76 U/L — ABNORMAL HIGH (ref 11–51)

## 2019-02-19 LAB — TROPONIN I
Troponin I: <0.03 ng/mL (ref <0.03)
Troponin I: <0.03 ng/mL (ref <0.03)

## 2019-02-19 MED ORDER — OXYCODONE-ACETAMINOPHEN 5-325 MG PO TABS
1.0000 | ORAL_TABLET | Freq: Once | ORAL | Status: AC
  Administered 2019-02-19: 1 via ORAL

## 2019-02-19 MED ORDER — OXYCODONE-ACETAMINOPHEN 5-325 MG PO TABS
ORAL_TABLET | ORAL | Status: AC
  Filled 2019-02-19: qty 1

## 2019-02-19 MED ORDER — LIDOCAINE VISCOUS HCL 2 % MT SOLN
15.0000 mL | Freq: Once | OROMUCOSAL | Status: AC
  Administered 2019-02-19: 15 mL via ORAL
  Filled 2019-02-19: qty 15

## 2019-02-19 MED ORDER — ALUM & MAG HYDROXIDE-SIMETH 200-200-20 MG/5ML PO SUSP
30.0000 mL | Freq: Once | ORAL | Status: AC
  Administered 2019-02-19: 30 mL via ORAL
  Filled 2019-02-19: qty 30

## 2019-02-19 NOTE — ED Provider Notes (Signed)
[facility] EMERGENCY DEPARTMENT Provider Note   CSN: [id number] Arrival date & time: 02/19/19  1213    History   Chief Complaint Chief Complaint  Patient presents with  . Gastroesophageal Reflux    HPI Martin Alvarado is a 48 y.o. male.     HPI   [name] with CP. Substernal. Onset prior to arrival. He was sitting watching tv when symptoms began. Worse when he exhales and he feels mildly SOB. He has actually been having similar symptoms for over a week. Evaluated for the same. Noted to have gallstones on UA and also currently being treat for esophagitis. He is on protonix and carafate. Episodes of pain normally last about 30 minutes. He has not noticed changes with exertion. No fever. No v/d.   Past Medical History:  Diagnosis Date  . Gallstone   . Reflux     There are no active problems to display for this patient.   No past surgical history on file.      Home Medications    Prior to Admission medications   Medication Sig Start Date End Date Taking? Authorizing Provider  doxycycline (VIBRAMYCIN) 100 MG capsule Take 1 capsule (100 mg total) by mouth 2 (two) times daily. For 10 days Patient not taking: Reported on 02/17/2019 02/27/14   Pauline Aus, PA-C  ibuprofen (ADVIL,MOTRIN) 800 MG tablet Take 1 tablet (800 mg total) by mouth 3 (three) times daily. Patient not taking: Reported on 02/17/2019 02/27/14   Pauline Aus, PA-C  oxyCODONE-acetaminophen (PERCOCET/ROXICET) 5-325 MG per tablet Take 1 tablet by mouth every 4 (four) hours as needed for severe pain. Patient not taking: Reported on 02/17/2019 02/27/14   Triplett, Tammy, PA-C  pantoprazole (PROTONIX) 20 MG tablet Take 1 tablet (20 mg total) by mouth daily for 14 days. 02/14/19 02/28/19  Couture, Cortni S, PA-C  sucralfate (CARAFATE) 1 g tablet Take 1 tablet (1 g total) by mouth 4 (four) times daily -  with meals and at bedtime for 13 days. 02/14/19 02/27/19  Couture, Cortni S, PA-C    Family History No family  history on file.  Social History Social History   Tobacco Use  . Smoking status: Never Smoker  . Smokeless tobacco: Never Used  Substance Use Topics  . Alcohol use: No  . Drug use: No     Allergies   Aspirin   Review of Systems Review of Systems  All systems reviewed and negative, other than as noted in HPI.  Physical Exam Updated Vital Signs BP (!) 143/95 (BP Location: Left Arm)   Pulse (!) 106   Temp 97.7 F (36.5 C) (Oral)   Resp (!) 28   Ht 5\' 7"  (1.702 m)   Wt 88 kg   SpO2 100%   BMI 30.39 kg/m   Physical Exam Vitals signs and nursing note reviewed.  Constitutional:      General: He is not in acute distress.    Appearance: He is well-developed.  HENT:     Head: Normocephalic and atraumatic.  Eyes:     General:        Right eye: No discharge.        Left eye: No discharge.     Conjunctiva/sclera: Conjunctivae normal.  Neck:     Musculoskeletal: Neck supple.  Cardiovascular:     Rate and Rhythm: Normal rate and regular rhythm.     Heart sounds: Normal heart sounds. No murmur. No friction rub. No gallop.   Pulmonary:     Effort:  Pulmonary effort is normal. No respiratory distress.     Breath sounds: Normal breath sounds.  Abdominal:     General: There is no distension.     Palpations: Abdomen is soft.     Tenderness: There is abdominal tenderness.     Comments: Mild epigastric tenderness w/o rebound or guarding.   Musculoskeletal:        General: No tenderness.  Skin:    General: Skin is warm and dry.  Neurological:     Mental Status: He is alert.  Psychiatric:        Behavior: Behavior normal.        Thought Content: Thought content normal.      ED Treatments / Results  Labs (all labs ordered are listed, but only abnormal results are displayed) Labs Reviewed  COMPREHENSIVE METABOLIC PANEL - Abnormal; Notable for the following components:      Result Value   Potassium 3.3 (*)    CO2 18 (*)    Glucose, Bld 114 (*)    Creatinine, Ser  1.34 (*)    AST 49 (*)    ALT 101 (*)    Alkaline Phosphatase 36 (*)    Total Bilirubin 2.6 (*)    All other components within normal limits  LIPASE, BLOOD - Abnormal; Notable for the following components:   Lipase 76 (*)    All other components within normal limits  CBC WITH DIFFERENTIAL/PLATELET - Abnormal; Notable for the following components:   Hemoglobin 12.9 (*)    HCT 38.4 (*)    All other components within normal limits  TROPONIN I  TROPONIN I    EKG EKG Interpretation  Date/Time:  Wednesday Feb 19 2019 12:28:23 EDT Ventricular Rate:  107 PR Interval:    QRS Duration: 82 QT Interval:  340 QTC Calculation: 454 R Axis:   59 Text Interpretation:  Sinus tachycardia Borderline T abnormalities, inferior leads Baseline wander in lead(s) V3 similar to previous from 02/17/19 Confirmed by Raeford RazorKohut, Naya [place]agan [phone]([id number]) on 02/19/2019 12:38:52 PM   Radiology Dg Chest 2 View  Result Date: 02/17/2019 CLINICAL DATA:  Chest pain and shortness of breath EXAM: CHEST - 2 VIEW COMPARISON:  Feb 14, 2019 FINDINGS: The lungs are clear. The heart size and pulmonary vascularity are normal. No adenopathy. No pneumothorax. No bone lesions. IMPRESSION: No edema or consolidation. Electronically Signed   By: Bretta BangWilliam  Woodruff III M.D.   On: 02/17/2019 18:16    Procedures Procedures (including critical care time)  Medications Ordered in ED Medications  alum & mag hydroxide-simeth (MAALOX/MYLANTA) 200-200-20 MG/5ML suspension 30 mL (30 mLs Oral Given 02/19/19 1315)    And  lidocaine (XYLOCAINE) 2 % viscous mouth solution 15 mL (15 mLs Oral Given 02/19/19 1315)     Initial Impression / Assessment and Plan / ED Course  I have reviewed the triage vital signs and the nursing notes.  Pertinent labs & imaging results that were available during my care of the patient were reviewed by me and considered in my medical decision making (see chart for details).  Still suspect symptoms GI. Labs noted but fairly  stable from recent. Afebrile. Advised to continue meds as previously prescribed. Has GI follow-up. Emergent return rpecautions discussed.   Final Clinical Impressions(s) / ED Diagnoses   Final diagnoses:  Chest pain, unspecified type    ED Discharge Orders    None       Raeford RazorKohut, Demarie Hyneman, MD 02/20/19 1558

## 2019-02-19 NOTE — ED Triage Notes (Signed)
Pt states that he is having chest pains and sob he has been to the hospital X2 at [facility]

## 2019-02-19 NOTE — ED Notes (Signed)
Pt c/o of central chest pain. VSS. MD notified. Verbal order for 1 tablet 5-325 mg of Percocet by Juleen China MD

## 2019-02-19 NOTE — ED Triage Notes (Signed)
Pt states that his acid reflux is acting up he is scheduled to have his gallbladder out at the end of the week.

## 2019-02-19 NOTE — Discharge Instructions (Signed)
I think your pain is related to your esophagus. I do not think this is a heart issue. I want you to continue your medications as prescribed. Follow-up with your gastroenterologist.

## 2019-04-01 ENCOUNTER — Other Ambulatory Visit: Payer: Self-pay | Admitting: Gastroenterology

## 2019-04-01 DIAGNOSIS — K769 Liver disease, unspecified: Secondary | ICD-10-CM

## 2020-03-07 IMAGING — CR CHEST - 2 VIEW
2 series · 2 of 2 positions shown · non-contrast
Comparison: None.

CLINICAL DATA: Chest pain, reflux, shortness of breath

EXAM:
CHEST - 2 VIEW

[w chest pa]
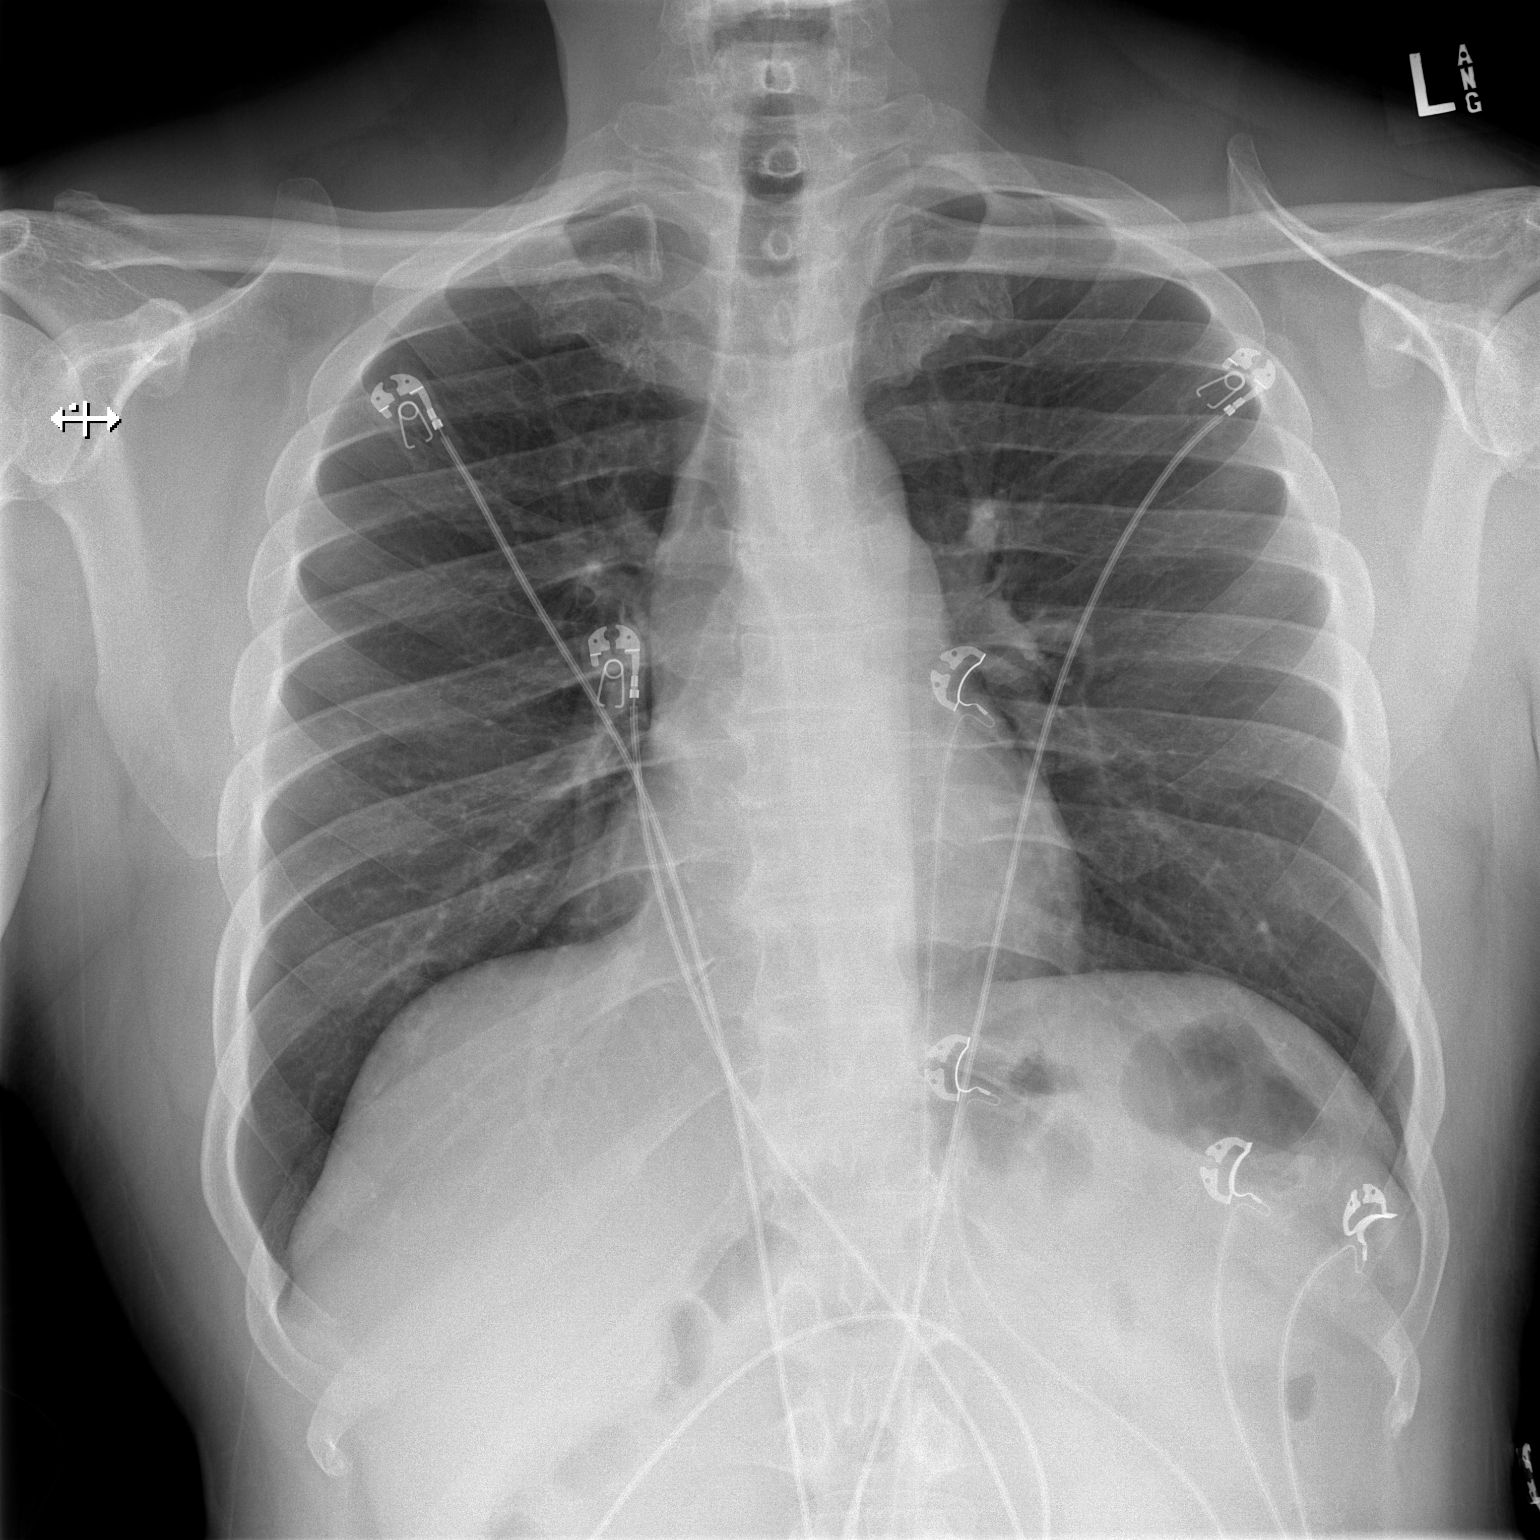

[w chest lat]
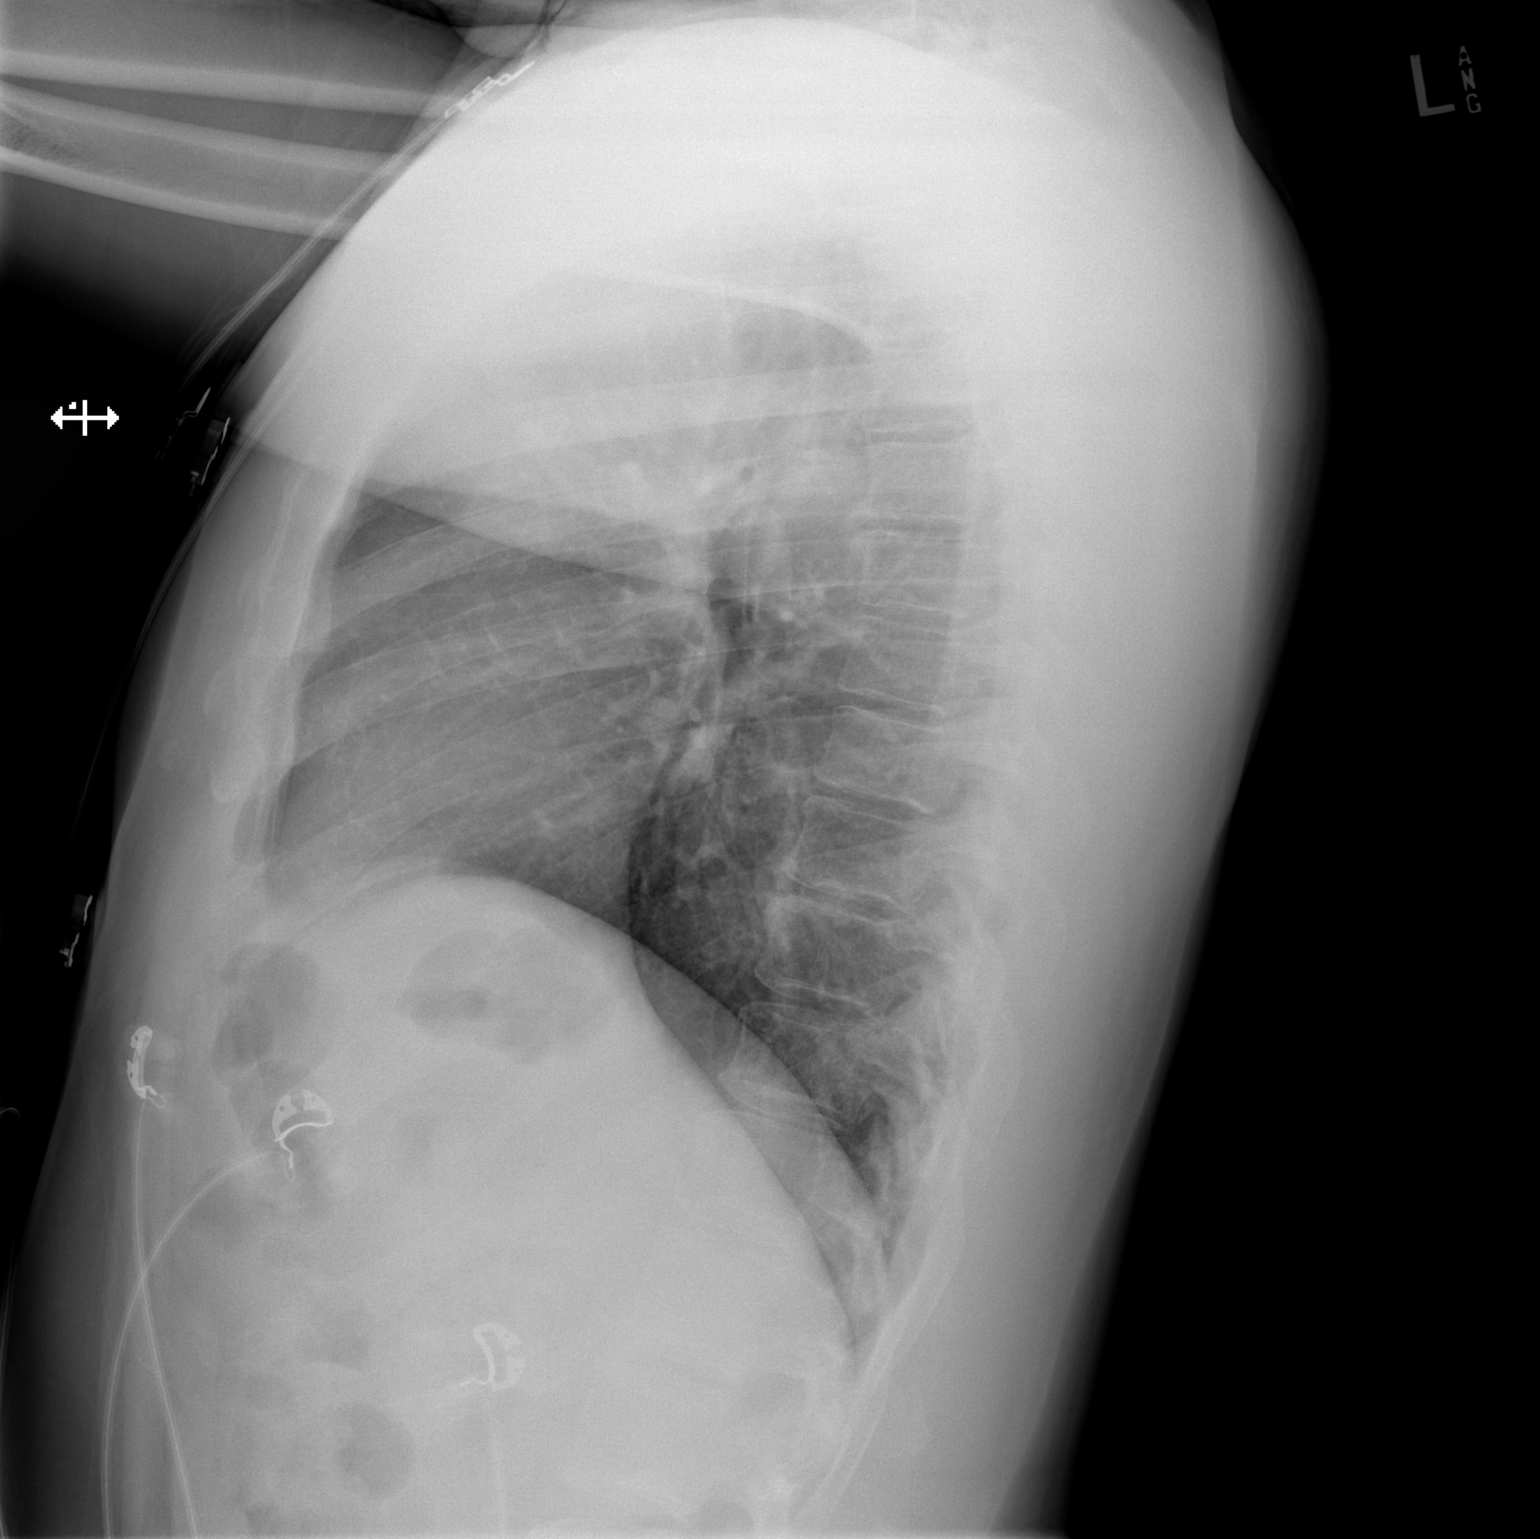

[2 of 2 positions shown; findings below may reference images not displayed]

FINDINGS: The heart size and mediastinal contours are within normal limits.
Both lungs are clear. The visualized skeletal structures are
unremarkable.
IMPRESSION: No active cardiopulmonary disease.
